# Patient Record
Sex: Male | Born: 1990 | Race: White | Hispanic: No | Marital: Single | Smoking: Never smoker
Health system: Southern US, Community
[De-identification: ages and names within clinical notes are randomized; demographics above are authoritative.]

## PROBLEM LIST (undated history)

## (undated) DIAGNOSIS — R011 Cardiac murmur, unspecified: Secondary | ICD-10-CM

## (undated) DIAGNOSIS — K219 Gastro-esophageal reflux disease without esophagitis: Secondary | ICD-10-CM

## (undated) DIAGNOSIS — M92529 Juvenile osteochondrosis of tibia tubercle, unspecified leg: Secondary | ICD-10-CM

## (undated) DIAGNOSIS — M9250 Juvenile osteochondrosis of tibia and fibula, unspecified leg: Secondary | ICD-10-CM

## (undated) HISTORY — DX: Juvenile osteochondrosis of tibia and fibula, unspecified leg: M92.50

## (undated) HISTORY — DX: Juvenile osteochondrosis of tibia tubercle, unspecified leg: M92.529

## (undated) HISTORY — PX: WISDOM TOOTH EXTRACTION: SHX21

---

## 2017-02-25 ENCOUNTER — Ambulatory Visit (INDEPENDENT_AMBULATORY_CARE_PROVIDER_SITE_OTHER): Payer: 59 | Admitting: Family Medicine

## 2017-02-25 ENCOUNTER — Encounter: Payer: Self-pay | Admitting: Family Medicine

## 2017-02-25 VITALS — BP 108/68 | HR 62 | Temp 98.5°F | Ht 70.0 in | Wt 152.1 lb

## 2017-02-25 DIAGNOSIS — Z13220 Encounter for screening for lipoid disorders: Secondary | ICD-10-CM

## 2017-02-25 DIAGNOSIS — M25562 Pain in left knee: Secondary | ICD-10-CM

## 2017-02-25 DIAGNOSIS — G8929 Other chronic pain: Secondary | ICD-10-CM

## 2017-02-25 DIAGNOSIS — R55 Syncope and collapse: Secondary | ICD-10-CM | POA: Diagnosis not present

## 2017-02-25 DIAGNOSIS — M542 Cervicalgia: Secondary | ICD-10-CM

## 2017-02-25 LAB — COMPREHENSIVE METABOLIC PANEL
ALBUMIN: 4.5 g/dL (ref 3.5–5.2)
ALT: 15 U/L (ref 0–53)
AST: 17 U/L (ref 0–37)
Alkaline Phosphatase: 72 U/L (ref 39–117)
BUN: 16 mg/dL (ref 6–23)
CALCIUM: 9.7 mg/dL (ref 8.4–10.5)
CHLORIDE: 106 meq/L (ref 96–112)
CO2: 26 mEq/L (ref 19–32)
CREATININE: 1.15 mg/dL (ref 0.40–1.50)
GFR: 82.57 mL/min (ref >60.00)
Glucose, Bld: 86 mg/dL (ref 70–99)
Potassium: 3.7 mEq/L (ref 3.5–5.1)
Sodium: 140 mEq/L (ref 135–145)
Total Bilirubin: 1 mg/dL (ref 0.2–1.2)
Total Protein: 6.8 g/dL (ref 6.0–8.3)

## 2017-02-25 LAB — CBC
HCT: 47.6 % (ref 39.0–52.0)
Hemoglobin: 15.9 g/dL (ref 13.0–17.0)
MCHC: 33.3 g/dL (ref 30.0–36.0)
MCV: 88.1 fl (ref 78.0–100.0)
Platelets: 204 10*3/uL (ref 150.0–400.0)
RBC: 5.41 Mil/uL (ref 4.22–5.81)
RDW: 13.1 % (ref 11.5–15.5)
WBC: 5.5 10*3/uL (ref 4.0–10.5)

## 2017-02-25 LAB — LIPID PANEL
CHOLESTEROL: 132 mg/dL (ref 0–200)
HDL: 33.6 mg/dL — AB (ref >39.00)
LDL CALC: 85 mg/dL (ref 0–99)
NonHDL: 97.92
TRIGLYCERIDES: 65 mg/dL (ref 0.0–149.0)
Total CHOL/HDL Ratio: 4
VLDL: 13 mg/dL (ref 0.0–40.0)

## 2017-02-25 NOTE — Patient Instructions (Signed)
We will call with the orthopedic referral.  I would continue OTC anti-inflammatories as needed. Consider PT. Watch your posture.  Labs today. I'm arranging and Echo.  Follow up if this persists.  Take care  Dr. Adriana Simasook

## 2017-02-27 ENCOUNTER — Encounter: Payer: Self-pay | Admitting: Family Medicine

## 2017-02-27 DIAGNOSIS — M542 Cervicalgia: Secondary | ICD-10-CM | POA: Insufficient documentation

## 2017-02-27 DIAGNOSIS — M25562 Pain in left knee: Secondary | ICD-10-CM | POA: Insufficient documentation

## 2017-02-27 DIAGNOSIS — R55 Syncope and collapse: Secondary | ICD-10-CM | POA: Insufficient documentation

## 2017-02-27 NOTE — Assessment & Plan Note (Signed)
[facility]. Sending to Ortho for discussion about surgical options (patient requested).

## 2017-02-27 NOTE — Assessment & Plan Note (Signed)
New problem. Likely vagal related (in setting of heavy lifting). No preceding symptoms. To be cautious, arranging Echo.

## 2017-02-27 NOTE — Progress Notes (Signed)
Subjective:  Patient ID: Douglas Foster, male    DOB: 1991-05-04  Age: 26 y.o. MRN: [id number]  CC: Establish care - Neck pain/HA, dizziness/presyncope at gym, left knee pain  HPI Douglas Foster is a 26 y.o. male presents to the clinic today to establish care. His concerns/complaints are discussed below.  Headache/Neck pain  X 3 weeks.  Location - Occipital region, neck extending downward.   Moderate in severity.  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  Improves with ibuprofen/NSAID's and massage but then recurs again.   No upper extremity numbness/tingling. No other associated symptoms.  Presyncope  Happened in the gym approximately 2 months ago.  He finished a heavy squat and rested. The proceed to leg machine (he was lying down).  He then became lightheaded/dizzy and felt faint.  He did not pass out.  No preceding palpitations, chest pain, SOB.  He has felt slightly dizzy in the gym at times since then.  No other associated symptoms.  Left knee pain  History of Osgood Schlatter.  Still has significant pain of his left knee at tibial tuberosity.  Wants to see Ortho for potential surgery.   PMH, Surgical Hx, Family Hx, Social History reviewed and updated as below.  Past Medical History:  Diagnosis Date  . Osgood-Schlatter's disease    Past Surgical History:  Procedure Laterality Date  . WISDOM TOOTH EXTRACTION       Family History  Problem Relation Age of Onset  . Arthritis Mother   . Alcohol abuse Maternal Grandmother   . Cancer Paternal Grandfather    Social History  Substance Use Topics  . Smoking status: Never Smoker  . Smokeless tobacco: Never Used  . Alcohol use Not on file   Review of Systems  Gastrointestinal: Positive for nausea.  Musculoskeletal: Positive for neck pain.       Knee pain.  Neurological: Positive for headaches.       Presyncope.  All other  systems reviewed and are negative.   Objective:   Today's Vitals: BP 108/68 (BP Location: Right Arm, Patient Position: Standing)   Pulse 62   Temp 98.5 F (36.9 C) (Oral)   Ht 5\' 10"  (1.778 m)   Wt 152 lb 2 oz (69 kg)   SpO2 99%   BMI 21.83 kg/m   Physical Exam  Constitutional: He is oriented to person, place, and time. He appears well-developed. No distress.  HENT:  Head: Normocephalic and atraumatic.  Mouth/Throat: Oropharynx is clear and moist.  Eyes: Conjunctivae are normal. No scleral icterus.  Neck:  Posterior neck with tenderness to palpation.   Cardiovascular: Normal rate and regular rhythm.   No murmur heard. Pulmonary/Chest: Effort normal and breath sounds normal. He has no wheezes. He has no rales.  Abdominal: Soft. He exhibits no distension. There is no tenderness.  Musculoskeletal:  Left knee - prominent tibial tuberosity consistent with prior Osgood schlatter.   Neurological: He is alert and oriented to person, place, and time.  Skin: Skin is warm. No rash noted.  Psychiatric: He has a normal mood and affect.  Vitals reviewed.   Assessment & Plan:   Problem List Items Addressed This Visit    Pre-syncope    New problem. Likely vagal related (in setting of heavy lifting). No preceding symptoms. To be cautious, arranging Echo.      Relevant Orders   CBC   Comprehensive metabolic panel  TSH   ECHOCARDIOGRAM COMPLETE   Neck pain - Primary    New problem. MSK in nature.  Appear secondary to chronic flexion at work. PRN NSAIDs. Note given for work.      Left knee pain    [facility] Sending to Ortho for discussion about surgical options (patient requested).      Relevant Orders   Ambulatory referral to Orthopedic Surgery    Other Visit Diagnoses    Screening, lipid       Relevant Orders   Lipid panel     Follow-up: Pending Echo.  Everlene OtherJayce Donne Baley DO [facility]

## 2017-02-27 NOTE — Assessment & Plan Note (Signed)
New problem. MSK in nature.  Appear secondary to chronic flexion at work. PRN NSAIDs. Note given for work.

## 2017-02-28 LAB — TSH: TSH: 1.5 u[IU]/mL (ref 0.35–4.50)

## 2017-03-24 ENCOUNTER — Ambulatory Visit: Payer: 59

## 2017-03-29 ENCOUNTER — Telehealth: Payer: Self-pay | Admitting: *Deleted

## 2017-03-29 ENCOUNTER — Ambulatory Visit
Admission: RE | Admit: 2017-03-29 | Discharge: 2017-03-29 | Disposition: A | Discharge status: Home / Self Care | Payer: 59 | Source: Ambulatory Visit | Attending: Family Medicine | Admitting: Family Medicine

## 2017-03-29 DIAGNOSIS — I5030 Unspecified diastolic (congestive) heart failure: Secondary | ICD-10-CM | POA: Diagnosis not present

## 2017-03-29 DIAGNOSIS — R55 Syncope and collapse: Secondary | ICD-10-CM | POA: Diagnosis not present

## 2017-03-29 DIAGNOSIS — I051 Rheumatic mitral insufficiency: Secondary | ICD-10-CM | POA: Insufficient documentation

## 2017-03-29 NOTE — Telephone Encounter (Signed)
Please advise 

## 2017-03-29 NOTE — Telephone Encounter (Signed)
Essentially normal echo

## 2017-03-29 NOTE — Telephone Encounter (Signed)
Pt has requested eco results  Pt contact [phone]

## 2017-03-29 NOTE — Progress Notes (Signed)
*  PRELIMINARY RESULTS* Echocardiogram 2D Echocardiogram has been performed.  Douglas Foster, Douglas Foster 03/29/2017, 11:27 AM

## 2017-03-30 NOTE — Telephone Encounter (Signed)
Patient advised of results.

## 2017-04-04 ENCOUNTER — Ambulatory Visit: Payer: 59 | Admitting: Family Medicine

## 2017-04-07 ENCOUNTER — Other Ambulatory Visit: Payer: Self-pay | Admitting: Orthopedic Surgery

## 2017-04-07 DIAGNOSIS — G8929 Other chronic pain: Secondary | ICD-10-CM

## 2017-04-07 DIAGNOSIS — M25562 Pain in left knee: Principal | ICD-10-CM

## 2017-04-15 ENCOUNTER — Ambulatory Visit
Admission: RE | Admit: 2017-04-15 | Discharge: 2017-04-15 | Disposition: A | Discharge status: Home / Self Care | Payer: 59 | Source: Ambulatory Visit | Attending: Orthopedic Surgery | Admitting: Orthopedic Surgery

## 2017-04-15 DIAGNOSIS — X58XXXA Exposure to other specified factors, initial encounter: Secondary | ICD-10-CM | POA: Diagnosis not present

## 2017-04-15 DIAGNOSIS — M25562 Pain in left knee: Secondary | ICD-10-CM | POA: Diagnosis present

## 2017-04-15 DIAGNOSIS — S76112A Strain of left quadriceps muscle, fascia and tendon, initial encounter: Secondary | ICD-10-CM | POA: Insufficient documentation

## 2017-04-15 DIAGNOSIS — M7052 Other bursitis of knee, left knee: Secondary | ICD-10-CM | POA: Insufficient documentation

## 2017-04-15 DIAGNOSIS — M9252 Juvenile osteochondrosis of tibia and fibula, left leg: Secondary | ICD-10-CM | POA: Diagnosis not present

## 2017-04-15 DIAGNOSIS — G8929 Other chronic pain: Secondary | ICD-10-CM | POA: Insufficient documentation

## 2017-07-20 ENCOUNTER — Encounter
Admission: RE | Admit: 2017-07-20 | Discharge: 2017-07-20 | Disposition: A | Discharge status: Home / Self Care | Payer: 59 | Source: Ambulatory Visit | Attending: Surgery | Admitting: Surgery

## 2017-07-20 HISTORY — DX: Cardiac murmur, unspecified: R01.1

## 2017-07-20 HISTORY — DX: Gastro-esophageal reflux disease without esophagitis: K21.9

## 2017-07-20 NOTE — Patient Instructions (Signed)
  Your procedure is scheduled on: 07-26-17 Report to Same Day Surgery 2nd floor medical mall [facility](Medical Mall Entrance-take elevator on left to 2nd floor.  Check in with surgery information desk.) To find out your arrival time please call [phone] between 1PM - 3PM on 07-25-17  Remember: Instructions that are not followed completely may result in serious medical risk, up to and including death, or upon the discretion of your surgeon and anesthesiologist your surgery may need to be rescheduled.    _x___ 1. Do not eat food after midnight the night before your procedure. NO GUM CHEWING OR CANDY AFTER MIDNIGHT.  You may drink clear liquids up to 2 hours before you are scheduled to arrive at the hospital for your procedure.  Do not drink clear liquids within 2 hours of your scheduled arrival to the hospital.  Clear liquids include  --Water or Apple juice without pulp  --Clear carbohydrate beverage such as ClearFast or Gatorade  --Black Coffee or Clear Tea (No milk, no creamers, do not add anything to the coffee or Tea      __x__ 2. No Alcohol for 24 hours before or after surgery.   __x__3. No Smoking for 24 prior to surgery.   ____  4. Bring all medications with you on the day of surgery if instructed.    __x__ 5. Notify your doctor if there is any change in your medical condition     (cold, fever, infections).     Do not wear jewelry, make-up, hairpins, clips or nail polish.  Do not wear lotions, powders, or perfumes. You may wear deodorant.  Do not shave 48 hours prior to surgery. Men may shave face and neck.  Do not bring valuables to the hospital.    [facility] is not responsible for any belongings or valuables.               Contacts, dentures or bridgework may not be worn into surgery.  Leave your suitcase in the car. After surgery it may be brought to your room.  For patients admitted to the hospital, discharge time is determined by your treatment team.   Patients discharged  the day of surgery will not be allowed to drive home.  You will need someone to drive you home and stay with you the night of your procedure.    Please read over the following fact sheets that you were given:   [facility] Preparing for [facility] and or MRSA Information   ____ Take anti-hypertensive listed below, cardiac, seizure, asthma,anti-reflux and psychiatric medicines. These include:  1. NONE  2.  3.  4.  5.  6.  ____Fleets enema or Magnesium Citrate as directed.   ____ Use CHG Soap or sage wipes as directed on instruction sheet   ____ Use inhalers on the day of surgery and bring to hospital day of surgery  ____ Stop Metformin and Janumet 2 days prior to surgery.    ____ Take 1/2 of usual insulin dose the night before surgery and none on the morning surgery.   ____ Follow recommendations from Cardiologist, Pulmonologist or PCP regarding stopping Aspirin, Coumadin, Plavix ,Eliquis, Effient, or Pradaxa, and Pletal.  X____Stop Anti-inflammatories such as Advil, Aleve, Ibuprofen, Motrin, Naproxen, Naprosyn, Goodies powders or aspirin products NOW-OK to take Tylenol    ____ Stop supplements until after surgery.    ____ Bring C-Pap to the hospital.

## 2017-07-26 ENCOUNTER — Other Ambulatory Visit: Payer: Self-pay

## 2017-07-26 ENCOUNTER — Encounter: Payer: Self-pay | Admitting: *Deleted

## 2017-07-26 ENCOUNTER — Ambulatory Visit: Payer: 59 | Admitting: Anesthesiology

## 2017-07-26 ENCOUNTER — Ambulatory Visit
Admission: RE | Admit: 2017-07-26 | Discharge: 2017-07-26 | Disposition: A | Discharge status: Home / Self Care | Payer: 59 | Source: Ambulatory Visit | Attending: Surgery | Admitting: Surgery

## 2017-07-26 ENCOUNTER — Encounter
Admission: RE | Disposition: A | Discharge status: Home / Self Care | Payer: Self-pay | Source: Ambulatory Visit | Attending: Surgery

## 2017-07-26 DIAGNOSIS — M9252 Juvenile osteochondrosis of tibia and fibula, left leg: Secondary | ICD-10-CM | POA: Diagnosis present

## 2017-07-26 DIAGNOSIS — M7652 Patellar tendinitis, left knee: Secondary | ICD-10-CM | POA: Diagnosis not present

## 2017-07-26 HISTORY — PX: PATELLAR TENDON REPAIR: SHX737

## 2017-07-26 LAB — URINE DRUG SCREEN, QUALITATIVE (ARMC ONLY)
AMPHETAMINES, UR SCREEN: NOT DETECTED
BENZODIAZEPINE, UR SCRN: NOT DETECTED
Barbiturates, Ur Screen: NOT DETECTED
Cannabinoid 50 Ng, Ur ~~LOC~~: NOT DETECTED
Cocaine Metabolite,Ur ~~LOC~~: NOT DETECTED
MDMA (ECSTASY) UR SCREEN: NOT DETECTED
METHADONE SCREEN, URINE: NOT DETECTED
Opiate, Ur Screen: NOT DETECTED
PHENCYCLIDINE (PCP) UR S: NOT DETECTED
TRICYCLIC, UR SCREEN: NOT DETECTED

## 2017-07-26 SURGERY — REPAIR, TENDON, PATELLAR
Anesthesia: General | Site: Knee | Laterality: Left | Wound class: Clean

## 2017-07-26 MED ORDER — CEFAZOLIN SODIUM-DEXTROSE 2-4 GM/100ML-% IV SOLN
2.0000 g | Freq: Once | INTRAVENOUS | Status: AC
  Administered 2017-07-26: 2 g via INTRAVENOUS

## 2017-07-26 MED ORDER — LIDOCAINE HCL (CARDIAC) 20 MG/ML IV SOLN
INTRAVENOUS | Status: DC | PRN
  Administered 2017-07-26: 100 mg via INTRAVENOUS

## 2017-07-26 MED ORDER — NEOMYCIN-POLYMYXIN B GU 40-200000 IR SOLN
Status: AC
  Filled 2017-07-26: qty 2

## 2017-07-26 MED ORDER — BUPIVACAINE-EPINEPHRINE (PF) 0.5% -1:200000 IJ SOLN
INTRAMUSCULAR | Status: AC
  Filled 2017-07-26: qty 30

## 2017-07-26 MED ORDER — OXYCODONE HCL 5 MG PO TABS: 5.0000 mg | ORAL_TABLET | ORAL | 0 refills | Status: AC | PRN

## 2017-07-26 MED ORDER — FAMOTIDINE 20 MG PO TABS: 20.0000 mg | ORAL_TABLET | Freq: Once | ORAL | Status: DC

## 2017-07-26 MED ORDER — FAMOTIDINE 20 MG PO TABS
ORAL_TABLET | ORAL | Status: AC
  Filled 2017-07-26: qty 1

## 2017-07-26 MED ORDER — PROPOFOL 10 MG/ML IV BOLUS
INTRAVENOUS | Status: DC | PRN
  Administered 2017-07-26: 150 mg via INTRAVENOUS
  Administered 2017-07-26: 50 mg via INTRAVENOUS

## 2017-07-26 MED ORDER — MIDAZOLAM HCL 2 MG/2ML IJ SOLN
INTRAMUSCULAR | Status: DC | PRN
  Administered 2017-07-26: 2 mg via INTRAVENOUS

## 2017-07-26 MED ORDER — DEXAMETHASONE SODIUM PHOSPHATE 4 MG/ML IJ SOLN
INTRAMUSCULAR | Status: DC | PRN
  Administered 2017-07-26: 6 mg via INTRAVENOUS

## 2017-07-26 MED ORDER — PROMETHAZINE HCL 25 MG/ML IJ SOLN: 6.2500 mg | INTRAMUSCULAR | Status: DC | PRN

## 2017-07-26 MED ORDER — FENTANYL CITRATE (PF) 100 MCG/2ML IJ SOLN
INTRAMUSCULAR | Status: AC
  Filled 2017-07-26: qty 2

## 2017-07-26 MED ORDER — FENTANYL CITRATE (PF) 100 MCG/2ML IJ SOLN
INTRAMUSCULAR | Status: DC | PRN
  Administered 2017-07-26: 50 ug via INTRAVENOUS
  Administered 2017-07-26 (×2): 25 ug via INTRAVENOUS

## 2017-07-26 MED ORDER — CEFAZOLIN SODIUM-DEXTROSE 2-4 GM/100ML-% IV SOLN
INTRAVENOUS | Status: AC
  Filled 2017-07-26: qty 100

## 2017-07-26 MED ORDER — PROPOFOL 10 MG/ML IV BOLUS
INTRAVENOUS | Status: AC
  Filled 2017-07-26: qty 20

## 2017-07-26 MED ORDER — FENTANYL CITRATE (PF) 100 MCG/2ML IJ SOLN: 25.0000 ug | INTRAMUSCULAR | Status: DC | PRN

## 2017-07-26 MED ORDER — KETOROLAC TROMETHAMINE 30 MG/ML IJ SOLN
INTRAMUSCULAR | Status: DC | PRN
  Administered 2017-07-26: 15 mg via INTRAVENOUS

## 2017-07-26 MED ORDER — LACTATED RINGERS IV SOLN
INTRAVENOUS | Status: DC
  Administered 2017-07-26: 14:00:00 via INTRAVENOUS

## 2017-07-26 MED ORDER — MIDAZOLAM HCL 2 MG/2ML IJ SOLN
INTRAMUSCULAR | Status: AC
  Filled 2017-07-26: qty 2

## 2017-07-26 MED ORDER — KETAMINE HCL 50 MG/ML IJ SOLN
INTRAMUSCULAR | Status: AC
  Filled 2017-07-26: qty 10

## 2017-07-26 MED ORDER — NEOMYCIN-POLYMYXIN B GU 40-200000 IR SOLN
Status: DC | PRN
  Administered 2017-07-26: 2 mL

## 2017-07-26 MED ORDER — DEXMEDETOMIDINE HCL IN NACL 200 MCG/50ML IV SOLN
INTRAVENOUS | Status: DC | PRN
  Administered 2017-07-26 (×2): 8 ug via INTRAVENOUS

## 2017-07-26 MED ORDER — BUPIVACAINE-EPINEPHRINE (PF) 0.5% -1:200000 IJ SOLN
INTRAMUSCULAR | Status: DC | PRN
  Administered 2017-07-26: 30 mL via PERINEURAL

## 2017-07-26 MED ORDER — GLYCOPYRROLATE 0.2 MG/ML IJ SOLN
INTRAMUSCULAR | Status: DC | PRN
  Administered 2017-07-26: 0.2 mg via INTRAVENOUS

## 2017-07-26 MED ORDER — KETAMINE HCL 50 MG/ML IJ SOLN
INTRAMUSCULAR | Status: DC | PRN
  Administered 2017-07-26: 35 mg via INTRAVENOUS

## 2017-07-26 MED ORDER — GLYCOPYRROLATE 0.2 MG/ML IJ SOLN
INTRAMUSCULAR | Status: AC
  Filled 2017-07-26: qty 1

## 2017-07-26 MED ORDER — LIDOCAINE HCL (PF) 2 % IJ SOLN
INTRAMUSCULAR | Status: AC
  Filled 2017-07-26: qty 10

## 2017-07-26 SURGICAL SUPPLY — 42 items
BANDAGE ACE 4X5 VEL STRL LF (GAUZE/BANDAGES/DRESSINGS) ×3 IMPLANT
BANDAGE ACE 6X5 VEL STRL LF (GAUZE/BANDAGES/DRESSINGS) ×3 IMPLANT
BLADE SURG SZ10 CARB STEEL (BLADE) ×6 IMPLANT
BNDG COHESIVE 4X5 TAN STRL (GAUZE/BANDAGES/DRESSINGS) ×3 IMPLANT
BNDG ESMARK 6X12 TAN STRL LF (GAUZE/BANDAGES/DRESSINGS) ×3 IMPLANT
BRACE KNEE POST OP SHORT (BRACE) ×3 IMPLANT
CANISTER SUCT 1200ML W/VALVE (MISCELLANEOUS) ×3 IMPLANT
CHLORAPREP W/TINT 26ML (MISCELLANEOUS) ×3 IMPLANT
DRAPE IMP U-DRAPE 54X76 (DRAPES) ×6 IMPLANT
ELECT REM PT RETURN 9FT ADLT (ELECTROSURGICAL) ×3
ELECTRODE REM PT RTRN 9FT ADLT (ELECTROSURGICAL) ×1 IMPLANT
GAUZE PETRO XEROFOAM 1X8 (MISCELLANEOUS) ×3 IMPLANT
GAUZE SPONGE 4X4 12PLY STRL (GAUZE/BANDAGES/DRESSINGS) ×3 IMPLANT
GLOVE BIO SURGEON STRL SZ8 (GLOVE) ×6 IMPLANT
GLOVE INDICATOR 8.0 STRL GRN (GLOVE) ×3 IMPLANT
GOWN STRL REUS W/ TWL LRG LVL3 (GOWN DISPOSABLE) ×2 IMPLANT
GOWN STRL REUS W/ TWL XL LVL3 (GOWN DISPOSABLE) ×1 IMPLANT
GOWN STRL REUS W/TWL LRG LVL3 (GOWN DISPOSABLE) ×4
GOWN STRL REUS W/TWL XL LVL3 (GOWN DISPOSABLE) ×2
IMMBOLIZER KNEE 19 BLUE UNIV (SOFTGOODS) IMPLANT
KIT RM TURNOVER STRD PROC AR (KITS) ×3 IMPLANT
NEEDLE FILTER BLUNT 18X 1/2SAF (NEEDLE) ×2
NEEDLE FILTER BLUNT 18X1 1/2 (NEEDLE) ×1 IMPLANT
NS IRRIG 500ML POUR BTL (IV SOLUTION) ×3 IMPLANT
PACK EXTREMITY ARMC (MISCELLANEOUS) ×3 IMPLANT
PAD CAST CTTN 4X4 STRL (SOFTGOODS) ×2 IMPLANT
PADDING CAST COTTON 4X4 STRL (SOFTGOODS) ×4
SPONGE LAP 18X18 5 PK (GAUZE/BANDAGES/DRESSINGS) ×3 IMPLANT
STAPLER SKIN PROX 35W (STAPLE) ×6 IMPLANT
STOCKINETTE IMPERVIOUS 9X36 MD (GAUZE/BANDAGES/DRESSINGS) ×3 IMPLANT
SUT BONE WAX W31G (SUTURE) ×3 IMPLANT
SUT ETHIBOND #5 BRAIDED 30INL (SUTURE) ×3 IMPLANT
SUT ETHIBOND CT1 BRD #0 30IN (SUTURE) ×3 IMPLANT
SUT VIC AB 2-0 CT1 27 (SUTURE) ×4
SUT VIC AB 2-0 CT1 TAPERPNT 27 (SUTURE) ×2 IMPLANT
SUT VIC AB 3-0 SH 27 (SUTURE) ×2
SUT VIC AB 3-0 SH 27X BRD (SUTURE) ×1 IMPLANT
SUTURE TAPE 1.3 40 TPR END (SUTURE) ×1 IMPLANT
SUTURETAPE 1.3 40 TPR END (SUTURE) ×3
SYR 20CC LL (SYRINGE) ×3 IMPLANT
SYRINGE 10CC LL (SYRINGE) ×3 IMPLANT
SYSTEM IMPL ACL/PCL SWIVILLOCK (Anchor) ×3 IMPLANT

## 2017-07-26 NOTE — Anesthesia Preprocedure Evaluation (Signed)
Anesthesia Evaluation  Patient identified by MRN, date of birth, ID band Patient awake    Reviewed: Allergy & Precautions, H&P , NPO status , Patient's Chart, lab work & pertinent test results, reviewed documented beta blocker date and time   History of Anesthesia Complications Negative for: history of anesthetic complications  Airway Mallampati: I  TM Distance: >3 FB Neck ROM: full    Dental  (+) Teeth Intact, Dental Advidsory Given Permanent retainer on bottom:   Pulmonary neg shortness of breath, asthma (as a child, hasn't used an inhaler in > 10 years) , neg recent URI,           Cardiovascular Exercise Tolerance: Good (-) hypertension(-) angina(-) CAD, (-) Past MI, (-) Cardiac Stents and (-) CABG (-) dysrhythmias + Valvular Problems/Murmurs      Neuro/Psych negative neurological ROS  negative psych ROS   GI/Hepatic Neg liver ROS, GERD  ,  Endo/Other  negative endocrine ROS  Renal/GU negative Renal ROS  negative genitourinary   Musculoskeletal   Abdominal   Peds  Hematology negative hematology ROS (+)   Anesthesia Other Findings Past Medical History: No date: GERD (gastroesophageal reflux disease)     Comment:  OCC- NO MEDS No date: Heart murmur     Comment:  ASYMPTOMATIC No date: Osgood-Schlatter's disease   Reproductive/Obstetrics negative OB ROS                             Anesthesia Physical Anesthesia Plan  ASA: II  Anesthesia Plan: General   Post-op Pain Management:    Induction: Intravenous  PONV Risk Score and Plan: 2 and Ondansetron and Dexamethasone  Airway Management Planned: LMA  Additional Equipment:   Intra-op Plan:   Post-operative Plan: Extubation in OR  Informed Consent: I have reviewed the patients History and Physical, chart, labs and discussed the procedure including the risks, benefits and alternatives for the proposed anesthesia with the patient  or authorized representative who has indicated his/her understanding and acceptance.   Dental Advisory Given  Plan Discussed with: Anesthesiologist, CRNA and Surgeon  Anesthesia Plan Comments:         Anesthesia Quick Evaluation

## 2017-07-26 NOTE — H&P (Signed)
Paper H&P to be scanned into permanent record. H&P reviewed and patient re-examined. No changes. 

## 2017-07-26 NOTE — Anesthesia Procedure Notes (Signed)
Procedure Name: LMA Insertion Date/Time: 07/26/2017 2:08 PM Performed by: Darrol JumpMarion, Khristine Verno, CRNA Pre-anesthesia Checklist: Patient identified, Emergency Drugs available, Suction available and Patient being monitored Patient Re-evaluated:Patient Re-evaluated prior to induction Oxygen Delivery Method: Circle system utilized Preoxygenation: Pre-oxygenation with 100% oxygen Induction Type: IV induction LMA: LMA inserted LMA Size: 5.0 Number of attempts: 2 Placement Confirmation: breath sounds checked- equal and bilateral and positive ETCO2 Dental Injury: Teeth and Oropharynx as per pre-operative assessment

## 2017-07-26 NOTE — Discharge Instructions (Addendum)
Keep dressing dry and intact.  May shower after dressing changed on post-op day #4 (Saturday).  Cover staples with Band-Aids after drying off. Apply ice frequently to knee. Take ibuprofen 800 mg TID with meals for 7-10 days, then as necessary. Take oxycodone as prescribed when needed.  May supplement with ES Tylenol if necessary. May weight-bear as tolerated so long as in brace locked in extension - use crutches as needed. Follow-up in 10-14 days or as scheduled.  AMBULATORY SURGERY  DISCHARGE INSTRUCTIONS   1) The drugs that you were given will stay in your system until tomorrow so for the next 24 hours you should not:  A) Drive an automobile B) Make any legal decisions C) Drink any alcoholic beverage   2) You may resume regular meals tomorrow.  Today it is better to start with liquids and gradually work up to solid foods.  You may eat anything you prefer, but it is better to start with liquids, then soup and crackers, and gradually work up to solid foods.   3) Please notify your doctor immediately if you have any unusual bleeding, trouble breathing, redness and pain at the surgery site, drainage, fever, or pain not relieved by medication.    4) Additional Instructions:     Please contact your physician with any problems or Same Day Surgery at [phone], Monday through Friday 6 am to 4 pm, or [place] at [facility] Main number at [phone].

## 2017-07-26 NOTE — Anesthesia Post-op Follow-up Note (Signed)
Anesthesia QCDR form completed.        

## 2017-07-26 NOTE — Progress Notes (Signed)
Left foot warm to touch, pink in color, good strong pedal Pulse.  Sensation intact, patient able to move his Left foot wnl.

## 2017-07-26 NOTE — Op Note (Signed)
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  Post-Op Diagnosis:   Same  Procedure:   Open debridement of patellar tendon with removal of painful bone fragments and osteophyte of tibial tubercle, left knee.  Surgeon:   Maryagnes AmosJ. Jeffrey Atharv Barriere, MD  Assistant:   Horris LatinoLance McGhee, PA-C; Marga HootsNicole Stephens, PA-S  Anesthesia:   General LMA  Findings:   As above.  Complications:   None  Fluids:   700 cc crystalloid  EBL:   5 cc  UOP:   None  TT:   45 minutes at 300 mmHg  Drains:   None  Closure:   Staples  Implants:   Arthrex 4.75 mm SwiveLock anchor x1  Brief Clinical Note:   The patient is a 26 year old male with a long history of a painful prominence on the tibial tubercle of his left knee, consistent with sequelae of Osgood-Schlatter's disease.  An MRI scan has confirmed the presence of loose bone fragments in the area of the tibial tubercle secondary to Osgood-Schlatter's disease as well as moderate tendinopathy of the central third of the inferior portion of the patella tendon in this area. The patient's symptoms have persisted despite medications, activity modification, etc. The patient presents at this time for debridement of the patellar tendon and excision of the bone fragments and painful osteophyte on the tibial tubercle of his left knee.  Procedure:   The patient was brought into the operating room and laid in the supine position.  After adequate general laryngeal mask anesthesia was obtained, the patient's left lower extremity was prepped with ChloraPrep solution before being draped sterilely.  Preoperative antivirals were administered.  After performing a timeout to verify the appropriate surgical site, the limb was exsanguinated with an Esmarch and the tourniquet inflated to 300 mmHg.  An approximately 5-6 cm incision was made over the anterior aspect of the knee centered over  the tibial tubercle.  The incision was carried down through the subcutaneous tissues to expose the superficial retinaculum.  This was split the length of the incision to expose the underlying distal portion of the patella tendon and tibial tubercle.  A longitudinal incision was made through the distal fibers of the patellar tendon in line with their fibers.  Dissection deep to the patellar tendon demonstrated numerous osteocartilaginous fragments which were removed sharply.  In addition, an osteotome was used to remove the large pretibial osteophyte located just proximal to the tibial tubercle.  The surface was smoothed out with a rongeur before being irrigated thoroughly with sterile saline solution.  A small amount of bone wax was applied over the exposed bony surface from excision of the osteophyte to reduce the likelihood of infrapatellar adhesions to this area.  The area of patellar tendinopathy also was identified and debrided sharply with a #15 blade back to healthy tissue margins.  Using an Arthrex 1.3 mm fiber tape, this tape was woven in a Krakw type fashion up one side of the patellar tendon defect site and down the other side so that both ends exited the distal portion of the tendon adjacent to the tibial tubercle.  These fiber tape ends were then secured to the tibia tubercle using a single Arthrex 4.75 mm SwiveLock anchor in order to reinforce that the patellar tendon attachment site.  Several 2-0 Vicryl interrupted sutures were then placed in a side-to-side fashion to better reapproximate the patellar tendon incision site.  The wound was copiously irrigated with sterile saline solution before the superficial retinacular layer and subcutaneous tissues were closed using 2-0 Vicryl interrupted sutures.  The skin was closed using staples.  A total of 20 cc of 0.5% Sensorcaine was injected and around the incision site to help with postoperative analgesia before a sterile bulky dressing was applied to  the knee.  The patient was placed into a hinged knee brace with hinges set at 0-90 degrees but locked in extension.  The patient was then awakened, extubated, and returned to the recovery room in satisfactory condition after tolerating the procedure well.

## 2017-07-26 NOTE — Transfer of Care (Signed)
Immediate Anesthesia Transfer of Care Note  Patient: Douglas Foster  Procedure(s) Performed: OPEN DEBRIDMENT OF PATELLA TENDON WITH DEBRIDEMENT OF THE TIBIAL TUBERCLE (Left Knee)  Patient Location: PACU  Anesthesia Type:General  Level of Consciousness: sedated and responds to stimulation  Airway & Oxygen Therapy: Patient Spontanous Breathing and Patient connected to face mask oxygen  Post-op Assessment: Report given to RN and Post -op Vital signs reviewed and stable  Post vital signs: Reviewed and stable  Last Vitals:  Vitals:   07/26/17 1211 07/26/17 1528  BP: 115/77 (!) 90/52  Pulse: 95 86  Resp: 14 10  Temp: (!) 36 C 36.4 C  SpO2: 100% 100%    Last Pain:  Vitals:   07/26/17 1211  TempSrc: Tympanic  PainSc: 2       Patients Stated Pain Goal: 0 (07/26/17 1211)  Complications: No apparent anesthesia complications

## 2017-07-27 ENCOUNTER — Encounter: Payer: Self-pay | Admitting: Surgery

## 2017-07-28 NOTE — Anesthesia Postprocedure Evaluation (Signed)
Anesthesia Post Note  Patient: Douglas Foster  Procedure(s) Performed: OPEN DEBRIDMENT OF PATELLA TENDON WITH DEBRIDEMENT OF THE TIBIAL TUBERCLE (Left Knee)  Patient location during evaluation: PACU Anesthesia Type: General Level of consciousness: awake and alert and oriented Pain management: pain level controlled Vital Signs Assessment: post-procedure vital signs reviewed and stable Respiratory status: spontaneous breathing, nonlabored ventilation and respiratory function stable Cardiovascular status: blood pressure returned to baseline and stable Postop Assessment: no signs of nausea or vomiting Anesthetic complications: no     Last Vitals:  Vitals:   07/26/17 1624 07/26/17 1700  BP: 113/74 115/68  Pulse: (!) 108 92  Resp: 14 16  Temp: 36.7 C   SpO2: 100% 100%    Last Pain:  Vitals:   07/26/17 1700  TempSrc:   PainSc: 2                  Douglas Foster

## 2017-09-04 IMAGING — MR MR KNEE*L* W/O CM
6 series · 39 of 40 positions shown · non-contrast
Comparison: None.

CLINICAL DATA: Chronic anterior knee pain.

EXAM:
MRI OF THE LEFT KNEE WITHOUT CONTRAST
TECHNIQUE: Multiplanar, multisequence MR imaging of the knee was performed. No
intravenous contrast was administered.

[Series 3: PD fat-sat · axial · 3.0mm · 0.50mm/px · z∈[-57,+61]mm · 9 of 37 slices shown (1 of 4)]
[im 1/37]
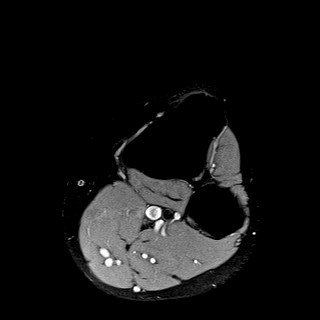
[im 5/37]
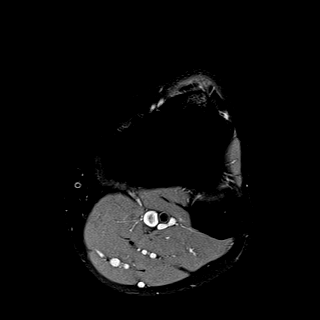
[im 10/37]
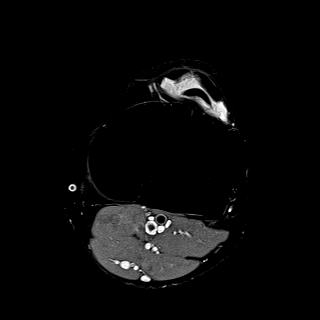
[im 14/37]
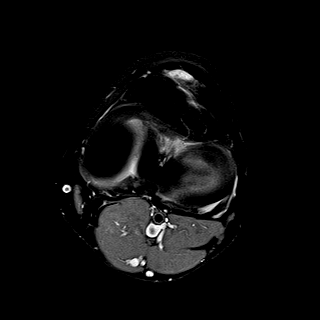
[im 19/37]
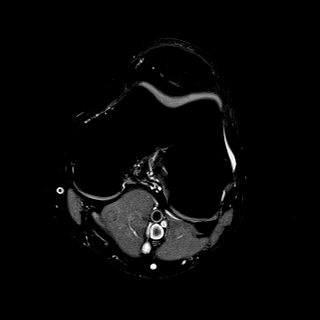
[im 23/37]
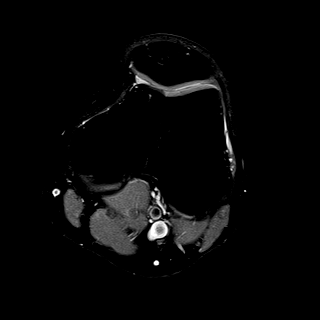
[im 28/37]
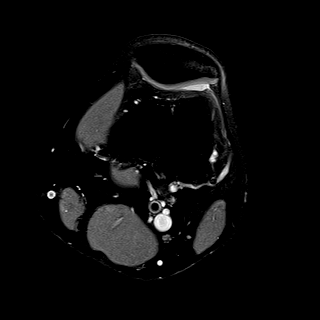
[im 32/37]
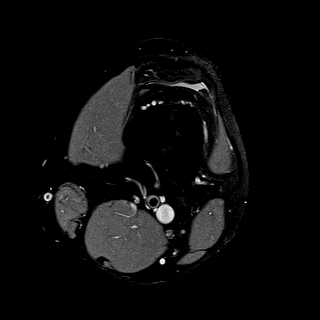
[im 37/37]
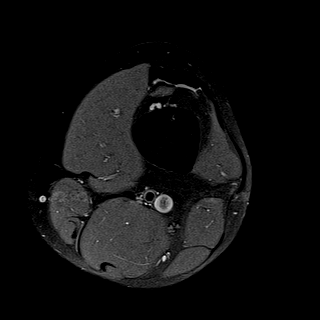

[Series 4: T1 · coronal · 3.0mm · 0.50mm/px · 6 of 29 slices shown]
[im 1/29]
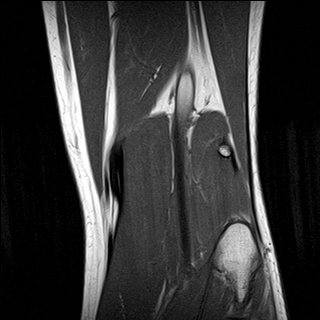
[im 5/29]
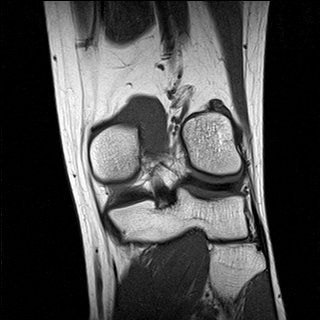
[im 10/29]
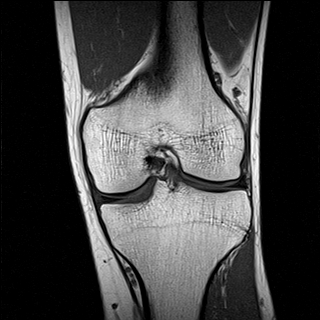
[im 15/29]
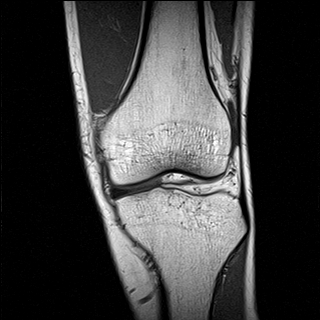
[im 19/29]
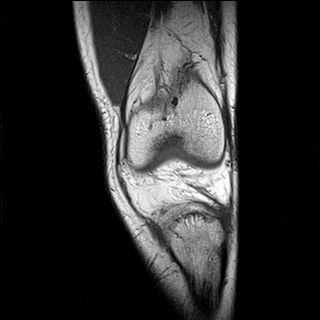
[im 24/29]
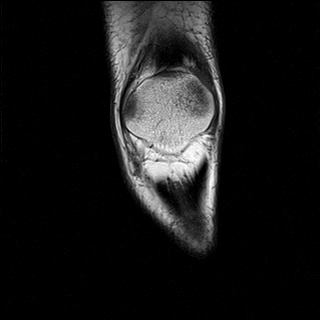

[Series 5: T2 fat-sat · coronal · 3.0mm · 0.31mm/px · 6 of 29 slices shown]
[im 1/29]
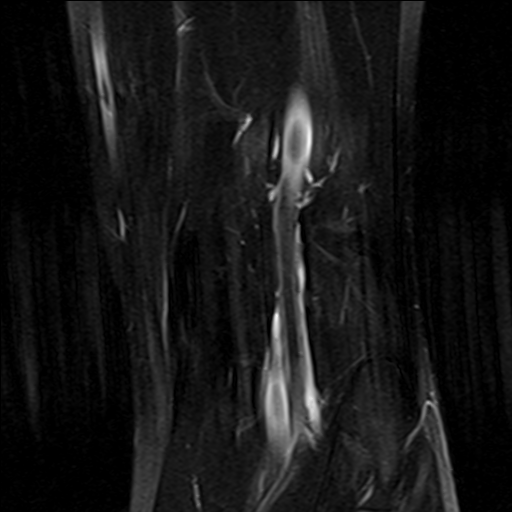
[im 6/29]
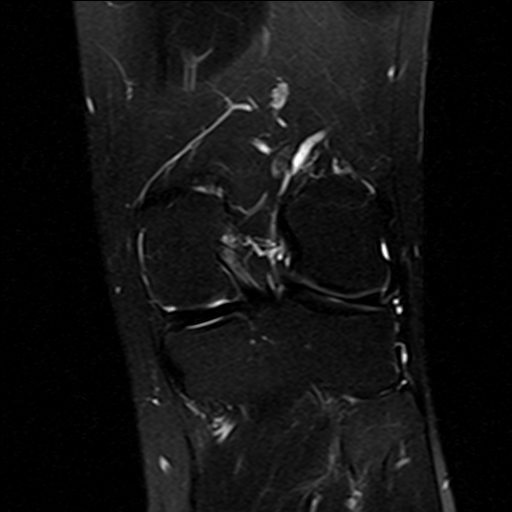
[im 12/29]
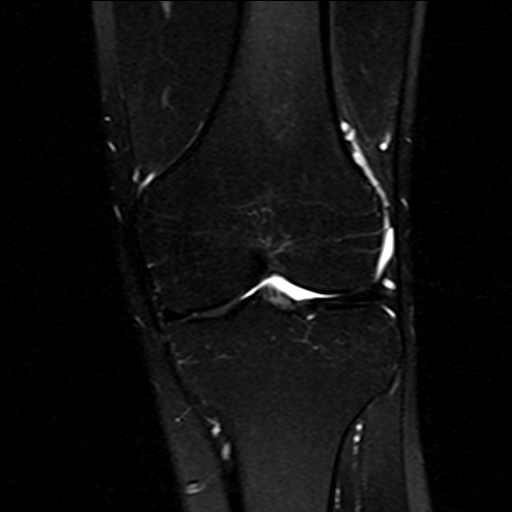
[im 17/29]
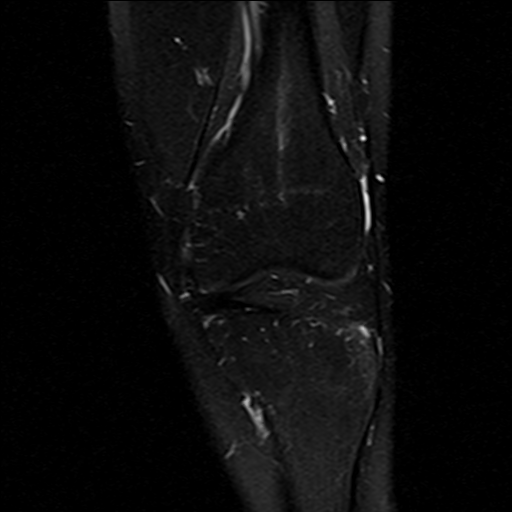
[im 23/29]
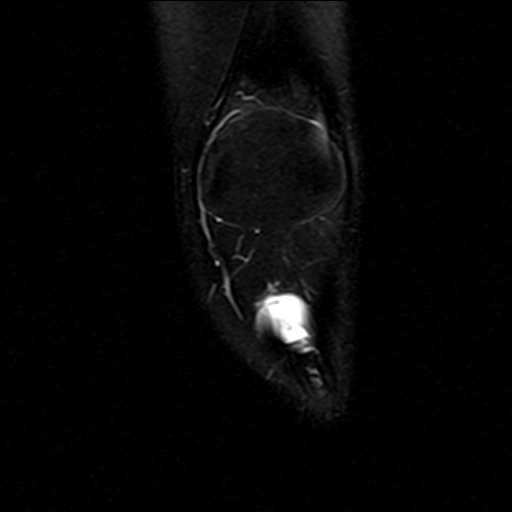
[im 29/29]
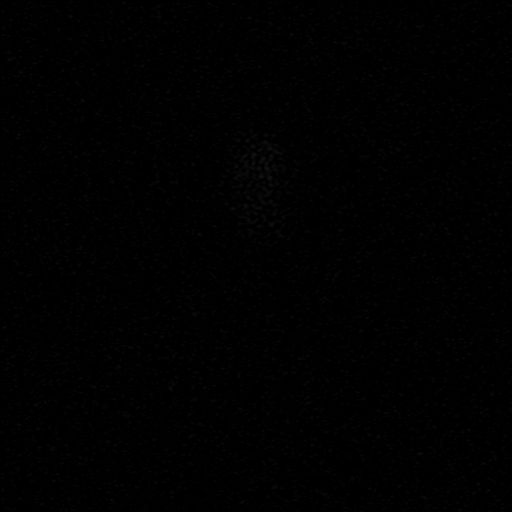

[Series 6: PD fat-sat · sagittal · 3.0mm · 0.50mm/px · 7 of 32 slices shown (2 of 4)]
[im 1/32]
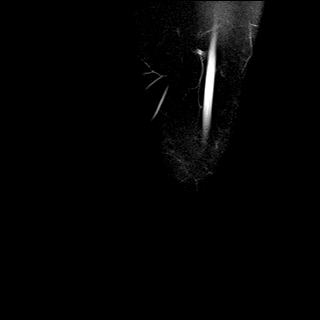
[im 6/32]
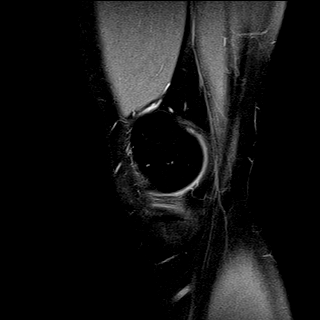
[im 11/32]
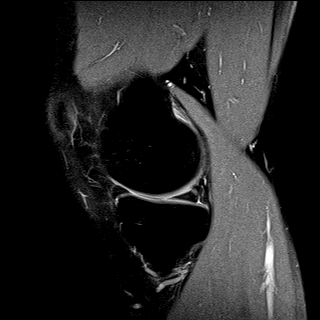
[im 16/32]
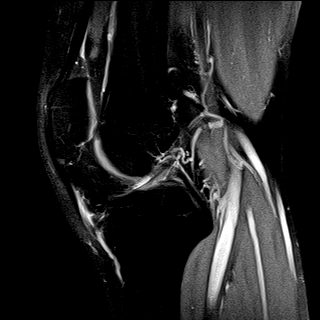
[im 21/32]
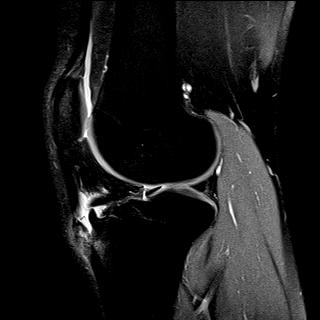
[im 26/32]
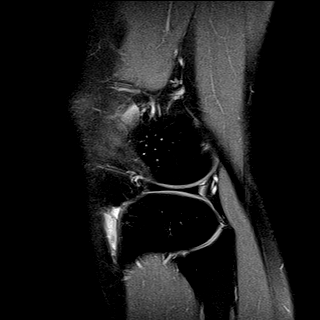
[im 32/32]
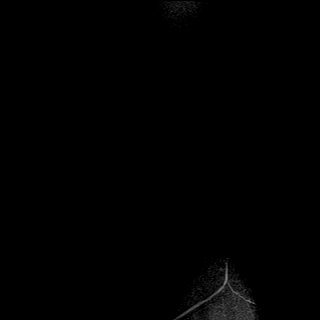

[Series 7: PD fat-sat · coronal · 3.0mm · 0.50mm/px · 6 of 29 slices shown (3 of 4)]
[im 1/29]
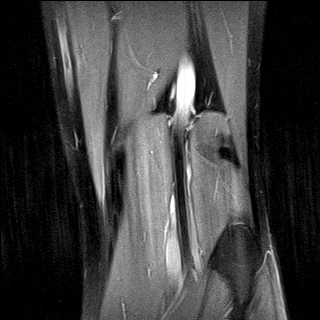
[im 6/29]
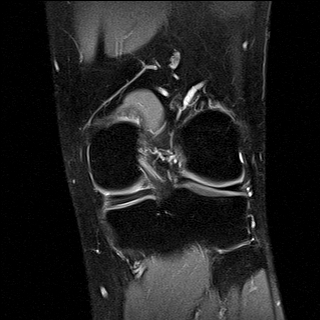
[im 12/29]
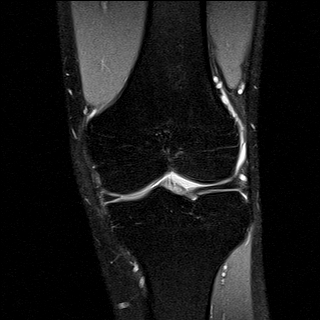
[im 17/29]
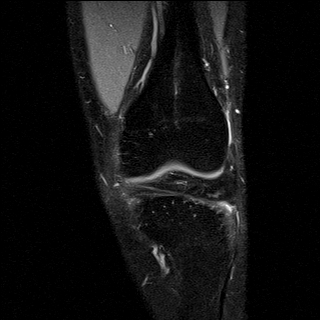
[im 23/29]
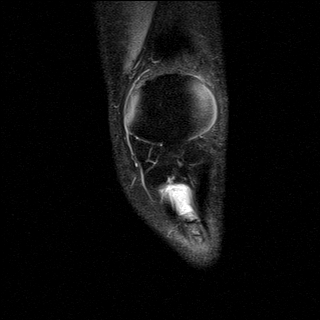
[im 29/29]
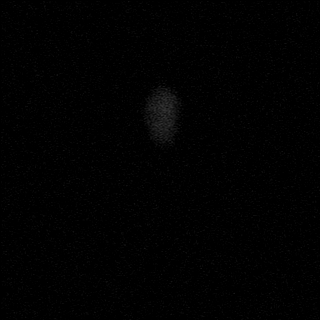

[Series 8: PD fat-sat · oblique · 2.0mm · 0.62mm/px · 5 of 24 slices shown (4 of 4)]
[im 1/24]
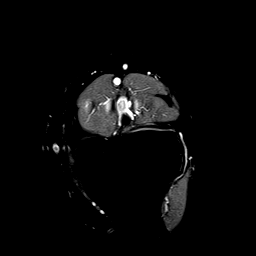
[im 6/24]
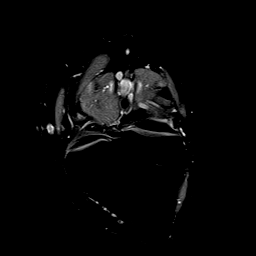
[im 12/24]
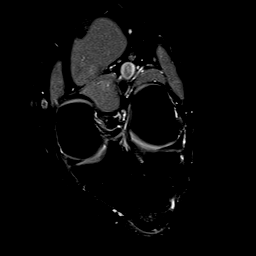
[im 18/24]
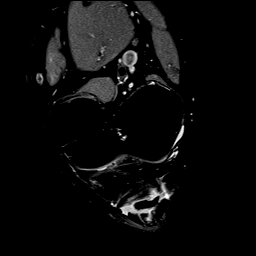
[im 24/24]
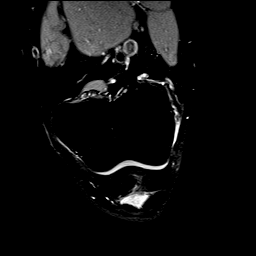

[39 of 40 positions shown; findings below may reference images not displayed]

FINDINGS: MENISCI

Medial meniscus:  Normal.

Lateral meniscus:  Normal.

LIGAMENTS

Cruciates:  Intact ACL and PCL.

Collaterals: Medial collateral ligament is intact. Lateral
collateral ligament complex is intact.

CARTILAGE

Patellofemoral:  Normal.

Medial:  Normal.

Lateral:  Normal.

Joint: Small joint effusion, primarily deep to the distal patellar
tendon in the deep infrapatellar bursa.

Popliteal Fossa:  Normal.

Extensor Mechanism: Severe degenerative changes of the distal
patellar tendon with a focal longitudinal midline split tear of the
distal patellar tendon. Severe calcific tendinopathy of the distal
patellar tendon.

Bones: Prominence of the anterior tibial tubercle. Otherwise normal.

Other: None
IMPRESSION: Severe calcific tendinopathy of the distal patellar tendon with a
focal longitudinal split tear. Secondary prominent deep
infrapatellar bursitis.
